# Patient Record
Sex: Male | Born: 1990 | Hispanic: No | Marital: Single | Smoking: Current every day smoker
Health system: Southern US, Community
[De-identification: ages and names within clinical notes are randomized; demographics above are authoritative.]

## PROBLEM LIST (undated history)

## (undated) DIAGNOSIS — J45909 Unspecified asthma, uncomplicated: Secondary | ICD-10-CM

## (undated) DIAGNOSIS — R45851 Suicidal ideations: Secondary | ICD-10-CM

---
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
## 2011-05-30 LAB — DIFFERENTIAL
Basophils Absolute: 0
Basophils Relative: 0
Eosinophils Absolute: 0.6
Eosinophils Relative: 4
Lymphocytes Relative: 10 — ABNORMAL LOW
Lymphs Abs: 1.5
Monocytes Absolute: 0.8
Monocytes Relative: 5
Neutro Abs: 12.4 — ABNORMAL HIGH
Neutrophils Relative %: 82 — ABNORMAL HIGH

## 2011-05-30 LAB — CBC
HCT: 46
Hemoglobin: 15.9
MCHC: 34.5
MCV: 85.8
Platelets: 269
RBC: 5.36
RDW: 13.7
WBC: 15.3 — ABNORMAL HIGH

## 2011-05-30 LAB — URINALYSIS, ROUTINE W REFLEX MICROSCOPIC
Bilirubin Urine: NEGATIVE
Ketones, ur: NEGATIVE
Nitrite: NEGATIVE
Protein, ur: NEGATIVE
pH: 6.5

## 2014-02-10 ENCOUNTER — Other Ambulatory Visit: Payer: Self-pay

## 2014-02-10 ENCOUNTER — Encounter (HOSPITAL_COMMUNITY): Payer: Self-pay | Admitting: Emergency Medicine

## 2014-02-10 DIAGNOSIS — Z8659 Personal history of other mental and behavioral disorders: Secondary | ICD-10-CM | POA: Insufficient documentation

## 2014-02-10 DIAGNOSIS — R45851 Suicidal ideations: Secondary | ICD-10-CM | POA: Insufficient documentation

## 2014-02-10 DIAGNOSIS — F17200 Nicotine dependence, unspecified, uncomplicated: Secondary | ICD-10-CM | POA: Insufficient documentation

## 2014-02-10 NOTE — ED Notes (Signed)
According to family, "having mental breakdown; recent break up with girlfriend." hx. Of SI in [employer]. Having si thoughts, "no specific acts to carry out si."

## 2014-02-11 ENCOUNTER — Inpatient Hospital Stay (HOSPITAL_COMMUNITY)
Admission: AD | Admit: 2014-02-11 | Discharge: 2014-02-14 | DRG: 885 | Disposition: A | Discharge status: Home / Self Care | Payer: Federal, State, Local not specified - Other | Source: Intra-hospital | Attending: Psychiatry | Admitting: Psychiatry

## 2014-02-11 ENCOUNTER — Encounter (HOSPITAL_COMMUNITY): Payer: Self-pay

## 2014-02-11 ENCOUNTER — Emergency Department (HOSPITAL_COMMUNITY)
Admission: EM | Admit: 2014-02-11 | Discharge: 2014-02-11 | Disposition: A | Discharge status: Other Acute Inpatient Hospital | Payer: Self-pay | Attending: Emergency Medicine | Admitting: Emergency Medicine

## 2014-02-11 DIAGNOSIS — R45851 Suicidal ideations: Secondary | ICD-10-CM

## 2014-02-11 DIAGNOSIS — F4323 Adjustment disorder with mixed anxiety and depressed mood: Secondary | ICD-10-CM | POA: Diagnosis present

## 2014-02-11 DIAGNOSIS — G4700 Insomnia, unspecified: Secondary | ICD-10-CM | POA: Diagnosis present

## 2014-02-11 DIAGNOSIS — F17200 Nicotine dependence, unspecified, uncomplicated: Secondary | ICD-10-CM | POA: Diagnosis present

## 2014-02-11 DIAGNOSIS — Z598 Other problems related to housing and economic circumstances: Secondary | ICD-10-CM | POA: Diagnosis not present

## 2014-02-11 DIAGNOSIS — J45909 Unspecified asthma, uncomplicated: Secondary | ICD-10-CM | POA: Diagnosis present

## 2014-02-11 DIAGNOSIS — F332 Major depressive disorder, recurrent severe without psychotic features: Principal | ICD-10-CM | POA: Diagnosis present

## 2014-02-11 DIAGNOSIS — Z5987 Material hardship due to limited financial resources, not elsewhere classified: Secondary | ICD-10-CM

## 2014-02-11 DIAGNOSIS — Z5989 Other problems related to housing and economic circumstances: Secondary | ICD-10-CM | POA: Diagnosis not present

## 2014-02-11 HISTORY — DX: Suicidal ideations: R45.851

## 2014-02-11 HISTORY — DX: Unspecified asthma, uncomplicated: J45.909

## 2014-02-11 LAB — COMPREHENSIVE METABOLIC PANEL
ALK PHOS: 57 U/L (ref 39–117)
ALT: 15 U/L (ref 0–53)
AST: 55 U/L — AB (ref 0–37)
Albumin: 4.2 g/dL (ref 3.5–5.2)
Anion gap: 15 (ref 5–15)
BILIRUBIN TOTAL: 0.5 mg/dL (ref 0.3–1.2)
BUN: 9 mg/dL (ref 6–23)
CALCIUM: 9.1 mg/dL (ref 8.4–10.5)
CHLORIDE: 98 meq/L (ref 96–112)
CO2: 24 meq/L (ref 19–32)
Creatinine, Ser: 1.04 mg/dL (ref 0.50–1.35)
GLUCOSE: 87 mg/dL (ref 70–99)
POTASSIUM: 4 meq/L (ref 3.7–5.3)
SODIUM: 137 meq/L (ref 137–147)
Total Protein: 7.4 g/dL (ref 6.0–8.3)

## 2014-02-11 LAB — CBC WITH DIFFERENTIAL/PLATELET
Basophils Absolute: 0 10*3/uL (ref 0.0–0.1)
Basophils Relative: 0 % (ref 0–1)
EOS PCT: 4 % (ref 0–5)
Eosinophils Absolute: 0.4 10*3/uL (ref 0.0–0.7)
HCT: 42.3 % (ref 39.0–52.0)
Hemoglobin: 15.5 g/dL (ref 13.0–17.0)
LYMPHS ABS: 2.9 10*3/uL (ref 0.7–4.0)
LYMPHS PCT: 31 % (ref 12–46)
MCH: 29.2 pg (ref 26.0–34.0)
MCHC: 36.6 g/dL — ABNORMAL HIGH (ref 30.0–36.0)
MCV: 79.7 fL (ref 78.0–100.0)
MONOS PCT: 9 % (ref 3–12)
Monocytes Absolute: 0.9 10*3/uL (ref 0.1–1.0)
Neutro Abs: 5.3 10*3/uL (ref 1.7–7.7)
Neutrophils Relative %: 56 % (ref 43–77)
PLATELETS: 235 10*3/uL (ref 150–400)
RBC: 5.31 MIL/uL (ref 4.22–5.81)
RDW: 12.9 % (ref 11.5–15.5)
WBC: 9.5 10*3/uL (ref 4.0–10.5)

## 2014-02-11 LAB — RAPID URINE DRUG SCREEN, HOSP PERFORMED
Amphetamines: NOT DETECTED
BARBITURATES: NOT DETECTED
Benzodiazepines: NOT DETECTED
Cocaine: NOT DETECTED
Opiates: NOT DETECTED
TETRAHYDROCANNABINOL: NOT DETECTED

## 2014-02-11 LAB — ETHANOL

## 2014-02-11 MED ORDER — IBUPROFEN 200 MG PO TABS
600.0000 mg | ORAL_TABLET | Freq: Three times a day (TID) | ORAL | Status: DC | PRN
  Filled 2014-02-11 (×2): qty 1

## 2014-02-11 MED ORDER — ALUM & MAG HYDROXIDE-SIMETH 200-200-20 MG/5ML PO SUSP: 30.0000 mL | ORAL | Status: DC | PRN

## 2014-02-11 MED ORDER — ACETAMINOPHEN 325 MG PO TABS: 650.0000 mg | ORAL_TABLET | Freq: Four times a day (QID) | ORAL | Status: DC | PRN

## 2014-02-11 MED ORDER — ONDANSETRON HCL 4 MG PO TABS
4.0000 mg | ORAL_TABLET | Freq: Three times a day (TID) | ORAL | Status: DC | PRN
Start: 2014-02-11 — End: 2014-02-11

## 2014-02-11 MED ORDER — LORAZEPAM 1 MG PO TABS
1.0000 mg | ORAL_TABLET | Freq: Three times a day (TID) | ORAL | Status: DC | PRN
Start: 2014-02-11 — End: 2014-02-11
  Administered 2014-02-11: 1 mg via ORAL
  Filled 2014-02-11: qty 1

## 2014-02-11 MED ORDER — ACETAMINOPHEN 325 MG PO TABS
650.0000 mg | ORAL_TABLET | ORAL | Status: DC | PRN
  Administered 2014-02-11: 650 mg via ORAL
  Filled 2014-02-11: qty 2

## 2014-02-11 MED ORDER — TRAZODONE HCL 50 MG PO TABS
50.0000 mg | ORAL_TABLET | Freq: Every evening | ORAL | Status: DC | PRN
  Filled 2014-02-11: qty 14

## 2014-02-11 MED ORDER — IBUPROFEN 600 MG PO TABS: 600.0000 mg | ORAL_TABLET | Freq: Three times a day (TID) | ORAL | Status: DC | PRN

## 2014-02-11 MED ORDER — ALUM & MAG HYDROXIDE-SIMETH 200-200-20 MG/5ML PO SUSP
30.0000 mL | ORAL | Status: DC | PRN
  Filled 2014-02-11: qty 30

## 2014-02-11 MED ORDER — MAGNESIUM HYDROXIDE 400 MG/5ML PO SUSP: 30.0000 mL | Freq: Every day | ORAL | Status: DC | PRN

## 2014-02-11 MED ORDER — LORAZEPAM 1 MG PO TABS: 1.0000 mg | ORAL_TABLET | Freq: Three times a day (TID) | ORAL | Status: DC | PRN

## 2014-02-11 MED ORDER — ACETAMINOPHEN 325 MG PO TABS: 650.0000 mg | ORAL_TABLET | ORAL | Status: DC | PRN

## 2014-02-11 MED ORDER — ONDANSETRON HCL 4 MG PO TABS: 4.0000 mg | ORAL_TABLET | Freq: Three times a day (TID) | ORAL | Status: DC | PRN

## 2014-02-11 NOTE — [facility] Suicide Risk Assessment (Signed)
[facility] INPATIENT:  Family/Significant Other Suicide Prevention Education  Suicide Prevention Education:  Education Completed; Robert SakaiDorothy Nicholson, Mother, [facility], [phone]; has been identified by the patient as the family member/significant other with whom the patient will be residing, and identified as the person(s) who will aid the patient in the event of a mental health crisis (suicidal ideations/suicide attempt).  With written consent from the patient, the family member/significant other has been provided the following suicide prevention education, prior to the and/or following the discharge of the patient.  The suicide prevention education provided includes the following:  Suicide risk factors  Suicide prevention and interventions  National Suicide Hotline telephone number  [facility] assessment telephone number  [facility] Emergency Assistance 911  [facility] and/or Residential Mobile Crisis Unit telephone number  Request made of family/significant other to:  Remove weapons (e.g., guns, rifles, knives), all items previously/currently identified as safety concern.  Mother advised patient does not have access to weapons.    Remove drugs/medications (over-the-counter, prescriptions, illicit drugs), all items previously/currently identified as a safety concern.  The family member/significant other verbalizes understanding of the suicide prevention education information provided.  The family member/significant other agrees to remove the items of safety concern listed above.  Wynn BankerHodnett, Tinlee Navarrette Hairston 02/11/2014, 1:02 PM

## 2014-02-11 NOTE — BH Assessment (Signed)
Received call for assessment. Spoke to HCA IncJoshua Geiple, PA-C who has no psychiatric history but had a "mental breakdown" resulting from a breakup with a girlfriend. Family is concerned about his behavior. Tele-assessment will be initiated.  Harlin RainFord Ellis Ria CommentWarrick Jr, LPC, [facility] Triage Specialist [phone]

## 2014-02-11 NOTE — Tx Team (Signed)
Interdisciplinary Treatment Plan Update   Date Reviewed:  02/11/2014  Time Reviewed:  10:03 AM  Progress in Treatment:   Attending groups: Yes Participating in groups: Yes Taking medication as prescribed: Yes  Tolerating medication: Yes Family/Significant other contact made:  No, but will ask patient for consent for collateral contact Patient understands diagnosis: Yes  Discussing patient identified problems/goals with staff: Yes Medical problems stabilized or resolved: Yes Denies suicidal/homicidal ideation: Yes Patient has not harmed self or others: Yes  For review of initial/current patient goals, please see plan of care.  Estimated Length of Stay:  3-4 days  Reasons for Continued Hospitalization:  Anxiety Depression Medication stabilization   New Problems/Goals identified:    Discharge Plan or Barriers:   Home with outpatient follow up to be determined  Additional Comments:  Attendees:  Patient:  02/11/2014 10:03 AM   Signature:  Sallyanne HaversF. Cobos, MD 02/11/2014 10:03 AM  Signature:  02/11/2014 10:03 AM  Signature:  Harold Barbanonecia Byrd, RN 02/11/2014 10:03 AM  Signature: Genelle GatherPatti Dukes, RN 02/11/2014 10:03 AM  Signature:  Lamount Crankerhris Judge,  RN 02/11/2014 10:03 AM  Signature:  Horace PorteousQuylle Abrish Erny, LCSW 02/11/2014 10:03 AM   02/11/2014 10:03 AM   02/11/2014 10:03 AM   02/11/2014 10:03 AM   02/11/2014  10:03 AM   02/11/2014  10:03 AM  Signature: 02/11/2014  10:03 AM    Scribe for Treatment Team:   Horace PorteousQuylle Leyan Branden,  02/11/2014 10:03 AM

## 2014-02-11 NOTE — Progress Notes (Signed)
Writer spoke with patient 1:1 and he reports that at first when he was admitted he didn't want to be here but since being here and attending groups today, it has been helpful. He reports that his mother visited him today and she is supportive of him. [name] spoke of his break up with his fiance and how now he feels that she was hiding something from him and he  Feels that its better for him to deal with this now rather than later after he had made plans to relocate. Patient is considering out patinet therapy. He currently denies si/hi/a/v hallucinations. He was informed of trazadone available to aid with sleep if needed and he reportst that he would rather try to fall asleep on his own. Patient reports plans to go back to school. Writer encouraged him to follow through with his plans. Safety maintained on unit with 15 min checks.

## 2014-02-11 NOTE — Progress Notes (Signed)
[facility] Group Notes:  (Nursing/MHT/Case Management/Adjunct)  Date:  02/11/2014  Time:  11:24 PM  Type of Therapy:  Group Therapy  Participation Level:  Did Not Attend  Participation Quality:  Did Not Attend  Affect:  Did Not Attend  Cognitive:  Did Not Attend  Insight:  None  Engagement in Group:  Did Not Attend  Modes of Intervention:  Socialization and Support  Summary of Progress/Problems: Pt. Did not attend.  Sondra ComeWilson, Tokiko Diefenderfer J 02/11/2014, 11:24 PM

## 2014-02-11 NOTE — ED Provider Notes (Signed)
CSN: [id number]     Arrival date & time 02/10/14  2345 History   First MD Initiated Contact with Patient 02/11/14 0003     Chief Complaint  Patient presents with  . Psychiatric Evaluation  . Suicidal     (Consider location/radiation/quality/duration/timing/severity/associated sxs/prior Treatment) HPI Comments: Patient with no past psychological history -- presents with complaint of "mental breakdown" after recently breaking up with his girlfriend. He states that he has had thoughts of "not wanting to be here". No plan. Patient joined the [employer] earlier this year but was medically discharged shortly afterwards for "mental breakdown". He does not know if he was diagnosed with depression, anxiety, other problems. He has no current medical complaints. No current medications. Denies tobacco, alcohol, illicit drug abuse. Denies auditory or visual hallucinations.  The history is provided by the patient and a relative.    Past Medical History  Diagnosis Date  . Depressed   . Suicidal thoughts    History reviewed. No pertinent past surgical history. History reviewed. No pertinent family history. History  Substance Use Topics  . Smoking status: Never Smoker   . Smokeless tobacco: Not on file  . Alcohol Use: No    Review of Systems  Constitutional: Negative for fever.  HENT: Negative for rhinorrhea and sore throat.   Eyes: Negative for redness.  Respiratory: Negative for cough.   Cardiovascular: Negative for chest pain.  Gastrointestinal: Negative for nausea, vomiting, abdominal pain and diarrhea.  Genitourinary: Negative for dysuria.  Musculoskeletal: Negative for myalgias.  Skin: Negative for rash.  Neurological: Negative for headaches.  Psychiatric/Behavioral: Positive for suicidal ideas.   Allergies  Review of patient's allergies indicates no known allergies.  Home Medications   Prior to Admission medications   Not on File   BP 125/80  Pulse 71  Temp(Src) 98.4 F (36.9  C) (Oral)  Resp 14  Ht 6\' 1"  (1.854 m)  Wt 225 lb (102.059 kg)  BMI 29.69 kg/m2  SpO2 98%  Physical Exam  Nursing note and vitals reviewed. Constitutional: He appears well-developed and well-nourished.  HENT:  Head: Normocephalic and atraumatic.  Eyes: Conjunctivae are normal. Right eye exhibits no discharge. Left eye exhibits no discharge.  Neck: Normal range of motion. Neck supple.  Cardiovascular: Normal rate, regular rhythm and normal heart sounds.   Pulmonary/Chest: Effort normal and breath sounds normal.  Abdominal: Soft. There is no tenderness.  Neurological: He is alert.  Skin: Skin is warm and dry.  Psychiatric: He has a normal mood and affect.    ED Course  Procedures (including critical care time) Labs Review Labs Reviewed  CBC WITH DIFFERENTIAL - Abnormal; Notable for the following:    MCHC 36.6 (*)    All other components within normal limits  COMPREHENSIVE METABOLIC PANEL - Abnormal; Notable for the following:    AST 55 (*)    All other components within normal limits  URINE RAPID DRUG SCREEN (HOSP PERFORMED)  ETHANOL    Imaging Review No results found.   EKG Interpretation None      12:14 AM Patient seen and examined. Work-up initiated. Holding orders completed. TTS eval pending.   Vital signs reviewed and are as follows: Filed Vitals:   02/10/14 2358  BP:   Pulse:   Temp: 98.4 F (36.9 C)  Resp:   BP 125/80  Pulse 71  Temp(Src) 98.4 F (36.9 C) (Oral)  Resp 14  Ht 6\' 1"  (1.854 m)  Wt 225 lb (102.059 kg)  BMI 29.69 kg/m2  SpO2 98%  2:51 AM [facility] Dr. Adela Glimpseabos service. Pt medically cleared.    MDM   Final diagnoses:  Suicidal ideation   Admit for inpt psych stabilization.   Renne CriglerJoshua Gurvir Schrom, PA-C 02/11/14 [phone]

## 2014-02-11 NOTE — BH Assessment (Signed)
Tele Assessment Note   Robert Cisneros is an 23 y.o. male, single African-American who [facility] of depression. Pt was initially brought by family members who were not present at time of assessment. Pt reports he "had a nervous breakdown" tonight due to his fiancee ending their relationship of one year. Pt states he was crying uncontrollably, feeling hurt, helpless and "wondering why a I here." Pt state he has been depressed for week with symptoms including crying spells, decreased sleep, "bad dreams", poor appetite, social withdrawal and feeling hopeless and "lifeless." Pt reports passive suicidal ideations, stating he wonders if people would be better off if he were gone and feels "I don't want to be here." He denies any suicide plan or any history of suicide attempts. 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 He reports he had similar symptoms to tonight and [facility]. Pt denies homicidal ideation or history of violence. Pt denies any psychotic symptoms. Pt denies any alcohol or substance abuse.  Pt describes how he had a childhood that lacked stability, stating his parents physically and verbally fought and eventually divorced. He reports his family relocated and changed school very frequently. He states he felt very guarded and "built up a lot of walls." He reports that he equated this relationship with his fiance as stability and intimacy and now the relationship is over he feels lost, hurt and unable to deal with it. He also reports being unemployed and having financial stress. He has no transportation. 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 Pt reports he has no outpatient mental health providers and is not on any psychiatric medication. He states his  mother and maternal grandmother have had problems with depression and anxiety.  Pt is dressed in a hospital gown, alert, oriented x4 with normal speech and normal motor behavior. Eye contact is fair and Pt was tearful throughout assessment. Pt's mood is depressed, sad, hopeless and affect is congruent with mood. Thought process is coherent and relevant. There is no indication Pt is currently responding to internal stimuli or experiencing delusional thought content. Pt was cooperative throughout assessment and receptive to recommendations.   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 Depressed   . Suicidal thoughts    Axis IV: economic problems, occupational problems and other psychosocial or environmental problems Axis V: GAF=35  Past Medical History:  Past Medical History  Diagnosis Date  . Depressed   . Suicidal thoughts     History reviewed. No pertinent past surgical history.  Family History: History reviewed. No pertinent family history.  Social History:  reports that he has never smoked. He does not have any smokeless tobacco history on file. He reports that he does not drink alcohol or use illicit drugs.  Additional Social History:  Alcohol / Drug Use Pain Medications: Denies abuse Prescriptions: Denies abuse Over the Counter: Denies abuse History of alcohol / drug use?: No history of alcohol / drug abuse Longest period of sobriety (when/how long): NA  CIWA: CIWA-Ar BP: 125/80 mmHg Pulse Rate: 71 COWS:    Allergies: No Known Allergies  Home Medications:  (Not in a hospital admission)  OB/GYN Status:  No LMP for male patient.  General Assessment Data [facility] Is this a Tele or Face-to-Face Assessment?: Tele Assessment Is this an Initial Assessment or a Re-assessment for this encounter?: Initial Assessment Living Arrangements: Other relatives (Living with  brother) Can pt return to current living arrangement?: Yes Admission Status: Voluntary Is patient capable of signing voluntary admission?: Yes Transfer from: Acute Hospital Referral Source: Self/Family/Friend     [facility] Living Arrangements: Other relatives (Living with brother) Name of Psychiatrist: None Name of Therapist: None  Education Status Is patient currently in school?: No Current Grade: NA Highest grade of school patient has completed: College Name of school: NA Contact person: NA  Risk to self Suicidal Ideation: Yes-Currently Present Suicidal Intent: No Is patient at risk for suicide?: Yes Suicidal Plan?: No Access to Means: No What has been your use of drugs/alcohol within the last 12 months?: Pt denies substance abuse Previous Attempts/Gestures: No How many times?: 0 Other Self Harm Risks: None Triggers for Past Attempts: None known Intentional Self Injurious Behavior: None Family Suicide History: No;See progress notes Recent stressful life event(s): Loss (Comment);Financial Problems;Job Loss (Fiancee broke up with him) Persecutory voices/beliefs?: No Depression: Yes Depression Symptoms: Despondent;Tearfulness;Loss of interest in usual pleasures;Feeling worthless/self pity;Guilt;Fatigue Substance abuse history and/or treatment for substance abuse?: No Suicide prevention information given to non-admitted patients: Not applicable  Risk to Others Homicidal Ideation: No Thoughts of Harm to Others: No Current Homicidal Intent: No Current Homicidal Plan: No Access to Homicidal Means: No Identified Victim: None History of harm to others?: No Assessment of Violence: None Noted Violent Behavior Description: None Does patient have access to weapons?: No Criminal Charges Pending?: No Does patient have a court date: No  Psychosis Hallucinations: None noted Delusions: None noted  Mental Status Report Appear/Hygiene: In scrubs;Unremarkable Eye  Contact: Fair Motor Activity: Unremarkable Speech: Logical/coherent Level of Consciousness: Alert;Crying Mood: Depressed;Helpless;Sad Affect: Depressed;Sad Anxiety Level: None Thought Processes: Coherent;Relevant Judgement: Unimpaired Orientation: Person;Place;Time;Situation Obsessive Compulsive Thoughts/Behaviors: None  Cognitive Functioning Concentration: Normal Memory: Recent Intact;Remote Intact IQ: Average Insight: Fair Impulse Control: Fair Appetite: Poor Weight Loss: 0 Weight Gain: 0 Sleep: Decreased Total Hours of Sleep: 6 Vegetative Symptoms: None  ADLScreening [facility] Assessment Services) Patient's cognitive ability adequate to safely complete daily activities?: Yes Patient able to express need for assistance with ADLs?: Yes Independently performs ADLs?: Yes (appropriate for developmental age)  Prior Inpatient Therapy Prior Inpatient Therapy: Yes Prior Therapy Dates: 10/2013 [facility] Reason for Treatment: Depression  Prior Outpatient Therapy Prior Outpatient Therapy: No Prior Therapy Dates: NA Prior Therapy Facilty/Provider(s): NA Reason for Treatment: NA  ADL Screening (condition at time of admission) Patient's cognitive ability adequate to safely complete daily activities?: Yes Is the patient deaf or have difficulty hearing?: No Does the patient have difficulty seeing, even when wearing glasses/contacts?: No Does the patient have difficulty concentrating, remembering, or making decisions?: No Patient able to express need for assistance with ADLs?: Yes Does the patient have difficulty dressing or bathing?: No Independently performs ADLs?: Yes (appropriate for developmental age) Does the patient have difficulty walking or climbing stairs?: No Weakness of Legs: None Weakness of Arms/Hands: None  Home Assistive Devices/Equipment Home Assistive Devices/Equipment: None    Abuse/Neglect  Assessment (Assessment to be complete while patient is alone) Physical Abuse: Denies Verbal Abuse: Denies Sexual Abuse: Denies Exploitation of patient/patient's resources: Denies Self-Neglect: Denies Values / Beliefs Cultural Requests During Hospitalization: None Spiritual Requests During Hospitalization:  None   Advance Directives (For Healthcare) Advance Directive: Patient does not have advance directive;Patient would not like information Pre-existing out of facility DNR order (yellow form or pink MOST form): No Nutrition Screen- [facility] Adult/[facility]/AP Patient's home diet: Regular  Additional Information 1:1 In Past 12 Months?: No CIRT Risk: No Elopement Risk: No Does patient have medical clearance?: Yes     Disposition: Binnie Rail, AC at [facility], confirms bed availability on inpatient unit, Observation Unit is currently closed.Jovita Gamma clinical report to Alberteen Sam, NP who accepted Pt to the service of Dr. Liam Graham, room 502-1. Notified Renne Crigler, NP and Phillips Grout, RN of acceptance.  Disposition Initial Assessment Completed for this Encounter: Yes Disposition of Patient: Inpatient treatment program Type of inpatient treatment program: Adult  Pamalee Leyden, [facility], [facility] Triage Specialist [phone]   Pamalee Leyden 02/11/2014 3:02 AM

## 2014-02-11 NOTE — Progress Notes (Signed)
Robert Cisneros is seen OOB UAL on the 500 hall today. HE remains quite tense, guarded and avoids interaction with this Clinical research associatewriter.   A HE has not completed his morning assessment, despite this nurse asking him x3. He does contract for safety with this nurse. He attends his groups but it is difficult to understand how engaged he is because he remaisn so very guarded...uptight...tense.   R Safety is in place and poc cont.
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
## 2014-02-11 NOTE — [facility] Group Notes (Signed)
[facility]   02/11/2014 12:40 PM    Participation Quality:  Appropraite  Mood/Affect:  Appropriate  Depression Rating:  1  Anxiety Rating:  1  Thoughts of Suicide:  No  Will you contract for safety?   NA  Current AVH:  No  Plan for Discharge/Comments:  Patient attended discharge planning group and actively participated in group.  Patient advised of not having an outpatient provider. CSW provided all participants with daily workbook.   Transportation Means: Patient has transportation.   Supports:  Patient has a support system.   Brice Potteiger, Joesph JulyQuylle Hairston

## 2014-02-11 NOTE — Progress Notes (Signed)
Pt reports that he had a " nervous breakdown" after his fiancee ended their relationship. Pt reports that he has been depressed, hopeless, isolative, and "not wanting to live". Pt also reports having crying spells, decreased appetite, crying spells, and insomnia. Pt denies any HI/AVH. Pt verbally contracts for safety. Pt reports a PMH of asthma. He denies having a past psych history. However, pt stated that he had experienced two " nervous breakdowns". Pt reports several family members ( mom, gma, brothers,etc.,) and friends as his support system.  [facility] his g[facility]. Pt was calm and cooperative on admission. Pt presented with a sad affect and was soft in speech.

## 2014-02-11 NOTE — ED Notes (Signed)
[facility] [employer] because of [facility] pt states" he just having thought that he don't want to be here any more." no having a plan to harm him self". Pt refers he is been a lot of stress lately. Pt denies hearing voices or having hallucinations.
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
## 2014-02-11 NOTE — BH Assessment (Signed)
Assessment complete. Binnie RailJoann Glover, [facility], confirms bed availability. Gave clinical report to Alberteen SamFran Hobson, NP who accepted Pt to the service of Dr. Liam GrahamFernando Cabos, room 502-1. Notified Renne CriglerJoshua Geiple, NP and Phillips GroutYasemia Lebron, RN of acceptance.  Harlin RainFord Ellis Ria CommentWarrick Jr, LPC, [facility] Triage Specialist [phone]

## 2014-02-11 NOTE — [facility] Group Notes (Signed)
[facility] LCSW Group Therapy  Feelings Around Relapse 1:15 -2:30        02/11/2014   Type of Therapy:  Group Therapy  Participation Level:  Appropriate  Participation Quality:  Appropriate  Affect:  Flat, Depressed  Cognitive: Appropriate  Insight:  Developing/Improving  Engagement in Therapy: Developing/Improving  Modes of Intervention:  Discussion Exploration Problem-Solving Supportive  Summary of Progress/Problems:  The topic for today was feelings around relapse.    Patient processed feelings toward relapse and was able to relate to peers.  Patient shared his relapse would be making contact again with ex-fiance who advised she wants to move out.  Patient identified coping skills that can be used to prevent a relapse.   Robert Cisneros, Robert Cisneros 02/11/2014

## 2014-02-12 DIAGNOSIS — F332 Major depressive disorder, recurrent severe without psychotic features: Principal | ICD-10-CM

## 2014-02-12 NOTE — Progress Notes (Addendum)
[facility] MD Progress Note  02/12/2014 6:34 AM Robert Cisneros  MRN:  [id number] Subjective:  He states he is feeling better than he was. Initially he didn't see a way out, now she is able to talk and express herself better. He currently expresses decreased levels of anxiety, rating 0/10 and her depression is at 0/10 (not depressed but sad). He states he is kind of getting back to normal. He reports resting well last night without the assistance of medication. Upon discharge he is hoping to relocate to [facility], and start over. He is attending groups, and have found them to be beneficial. Groups have helped him, and he is now able to see the bigger picture. Denies any SI/HI/AVH.  Diagnosis:   DSM5: Schizophrenia Disorders:   Obsessive-Compulsive Disorders:   Trauma-Stressor Disorders:   Substance/Addictive Disorders:   Depressive Disorders:  Major Depressive Disorder - Severe (296.23) Total Time spent with patient: 20 minutes  Axis I: Major Depression, Recurrent severe Axis II: Deferred Axis IV: economic problems, other psychosocial or environmental problems, problems with access to health care services and problems with primary support group Axis V: 61-70 mild symptoms  ADL's:  Intact  Sleep: Good  Appetite:  Good  Suicidal Ideation:  Plan:  Denies Intent:  Denies Means:  Denies Homicidal Ideation:  Plan:  Denies Intent:  Denies Means:  Denies AEB (as evidenced by):  Psychiatric Specialty Exam: Physical Exam  ROS  Blood pressure 107/68, pulse 93, temperature 97.9 F (36.6 C), temperature source Oral, resp. rate 16, height _0  (1.854 m), weight 100.699 kg (222 lb).Body mass index is 29.3 kg/(m^2).  General Appearance: Fairly Groomed  Engineer, water::  Good  Speech:  Clear and Coherent and Normal Rate  Volume:  Normal  Mood:  Depressed and Euthymic  Affect:  Appropriate and Congruent  Thought Process:  Coherent and Goal Directed  Orientation:  Full (Time, Place, and Person)   Thought Content:  WDL  Suicidal Thoughts:  No  Homicidal Thoughts:  No  Memory:  Immediate;   Good Recent;   Good Remote;   Good  Judgement:  Good  Insight:  Good  Psychomotor Activity:  Normal  Concentration:  Good  Recall:  Good  Fund of Knowledge:Good  Language: Good  Akathisia:  Yes  Handed:  Right  AIMS (if indicated):     Assets:  Communication Skills Desire for Improvement Financial Resources/Insurance Leisure Time Physical Health Resilience Social Support  Sleep:  Number of Hours: 6.75   Musculoskeletal: Strength & Muscle Tone: within normal limits Gait & Station: normal Patient leans: N/A  Current Medications: Current Facility-Administered Medications  Medication Dose Route Frequency Provider Last Rate Last Dose  . acetaminophen (TYLENOL) tablet 650 mg  650 mg Oral Q6H PRN Lurena Nida, NP      . alum & mag hydroxide-simeth (MAALOX/MYLANTA) 200-200-20 MG/5ML suspension 30 mL  30 mL Oral Q4H PRN Lurena Nida, NP      . magnesium hydroxide (MILK OF MAGNESIA) suspension 30 mL  30 mL Oral Daily PRN Lurena Nida, NP      . traZODone (DESYREL) tablet 50 mg  50 mg Oral QHS PRN Lurena Nida, NP        Lab Results:  Results for orders placed during the hospital encounter of 02/11/14 (from the past 48 hour(s))  CBC WITH DIFFERENTIAL     Status: Abnormal   Collection Time    02/11/14 12:27 AM      Result Value Ref  Range   WBC 9.5  4.0 - 10.5 K/uL   RBC 5.31  4.22 - 5.81 MIL/uL   Hemoglobin 15.5  13.0 - 17.0 g/dL   HCT 42.3  39.0 - 52.0 %   MCV 79.7  78.0 - 100.0 fL   MCH 29.2  26.0 - 34.0 pg   MCHC 36.6 (*) 30.0 - 36.0 g/dL   RDW 12.9  11.5 - 15.5 %   Platelets 235  150 - 400 K/uL   Neutrophils Relative % 56  43 - 77 %   Lymphocytes Relative 31  12 - 46 %   Monocytes Relative 9  3 - 12 %   Eosinophils Relative 4  0 - 5 %   Basophils Relative 0  0 - 1 %   Neutro Abs 5.3  1.7 - 7.7 K/uL   Lymphs Abs 2.9  0.7 - 4.0 K/uL   Monocytes Absolute 0.9  0.1 - 1.0  K/uL   Eosinophils Absolute 0.4  0.0 - 0.7 K/uL   Basophils Absolute 0.0  0.0 - 0.1 K/uL  COMPREHENSIVE METABOLIC PANEL     Status: Abnormal   Collection Time    02/11/14 12:27 AM      Result Value Ref Range   Sodium 137  137 - 147 mEq/L   Potassium 4.0  3.7 - 5.3 mEq/L   Chloride 98  96 - 112 mEq/L   CO2 24  19 - 32 mEq/L   Glucose, Bld 87  70 - 99 mg/dL   BUN 9  6 - 23 mg/dL   Creatinine, Ser 1.04  0.50 - 1.35 mg/dL   Calcium 9.1  8.4 - 10.5 mg/dL   Total Protein 7.4  6.0 - 8.3 g/dL   Albumin 4.2  3.5 - 5.2 g/dL   AST 55 (*) 0 - 37 U/L   ALT 15  0 - 53 U/L   Alkaline Phosphatase 57  39 - 117 U/L   Total Bilirubin 0.5  0.3 - 1.2 mg/dL   GFR calc non Af Amer >90  >90 mL/min   GFR calc Af Amer >90  >90 mL/min   Comment: (NOTE)     The eGFR has been calculated using the CKD EPI equation.     This calculation has not been validated in all clinical situations.     eGFR's persistently <90 mL/min signify possible Chronic Kidney     Disease.   Anion gap 15  5 - 15  ETHANOL     Status: None   Collection Time    02/11/14 12:27 AM      Result Value Ref Range   Alcohol, Ethyl (B) <11  0 - 11 mg/dL   Comment:            LOWEST DETECTABLE LIMIT FOR     SERUM ALCOHOL IS 11 mg/dL     FOR MEDICAL PURPOSES ONLY  URINE RAPID DRUG SCREEN (HOSP PERFORMED)     Status: None   Collection Time    02/11/14 12:35 AM      Result Value Ref Range   Opiates NONE DETECTED  NONE DETECTED   Cocaine NONE DETECTED  NONE DETECTED   Benzodiazepines NONE DETECTED  NONE DETECTED   Amphetamines NONE DETECTED  NONE DETECTED   Tetrahydrocannabinol NONE DETECTED  NONE DETECTED   Barbiturates NONE DETECTED  NONE DETECTED   Comment:            DRUG SCREEN FOR MEDICAL PURPOSES     ONLY.  IF CONFIRMATION IS NEEDED     FOR ANY PURPOSE, NOTIFY LAB     WITHIN 5 DAYS.                LOWEST DETECTABLE LIMITS     FOR URINE DRUG SCREEN     Drug Class       Cutoff (ng/mL)     Amphetamine      1000     Barbiturate       200     Benzodiazepine   250     Tricyclics       037     Opiates          300     Cocaine          300     THC              50    Physical Findings: AIMS:  , ,  ,  ,    CIWA:    COWS:     Treatment Plan Summary: Daily contact with patient to assess and evaluate symptoms and progress in treatment Medication management  Plan: Review of chart, vital signs, medications, and notes.  1-Admit for crisis management and stabilization. Estimated length of stay 3-5 days past his current stay of 1  2-Individual and group therapy encouraged  3-Medication management for depression is on hold at this time and anxiety to reduce current symptoms to base line and improve the patient's overall level of functioning: Medications reviewed with the patient and Trazodone added for sleep issues and Vistaril 25 mg every six hours PRN anxiety  4-Coping skills for depression and anxiety developing--  5-Continue crisis stabilization and management  6-Address health issues--monitoring vital signs, stable  7-Treatment plan in progress to prevent relapse of depression, angry outbursts, and anxiety  8-Psychosocial education regarding relapse prevention and self-care  9-Health care follow up as needed for any health concerns  10-Call for consult with hospitalist for additional specialty patient services as needed.   Medical Decision Making Problem Points:  Established problem, stable/improving (1), Review of last therapy session (1) and Review of psycho-social stressors (1) Data Points:  Review or order clinical lab tests (1) Review or order medicine tests (1) Review of new medications or change in dosage (2)  I certify that inpatient services furnished can reasonably be expected to improve the patient's condition.   Priscille Loveless S FNP-BC 02/12/2014, 6:34 AM I agree with assessment and plan Geralyn Flash A. Sabra Heck, M.D.

## 2014-02-12 NOTE — Progress Notes (Signed)
Robert Cisneros is seen OOB UAL on the unit..he tolerates this quietly. He remains sad, distant and unwilling to engage in conversation and tightlipped aboput his feelings as well.

## 2014-02-12 NOTE — Progress Notes (Signed)
Writer has observed patient up in the dayroom laughing and talking with peers. He attended group and participated. He reports that he is working on his coping skill and has a great support system. His mother and a friend visited him on today. He voiced no complaints and denies si/hi/a/v hallucinations. Writer praised him for his positive outlook dealing with his current break up. Safety maintained on unit with 15 min checks.

## 2014-02-12 NOTE — Progress Notes (Signed)
Psychoeducational Group Note  Date: 02/12/2014 Time:  1015  Group Topic/Focus:  Identifying Needs:   The focus of this group is to help patients identify their personal needs that have been historically problematic and identify healthy behaviors to address their needs.  Participation Level:  Active  Participation Quality:  Appropriate  Affect:  Appropriate  Cognitive:  Oriented  Insight:  Improving  Engagement in Group:  Engaged  Additional Comments:  Pt attended and was involved in the discussions. Added much to the group  Trejon Duford A  

## 2014-02-12 NOTE — [facility] Group Notes (Signed)
[facility] Group Notes:  (Nursing/MHT/Case Management/Adjunct)  Date:  02/12/2014  Time:  11:33 AM  Type of Therapy:  Psychoeducational Skills  Participation Level:  Active  Participation Quality:  Appropriate  Affect:  Appropriate  Cognitive:  Appropriate  Insight:  Appropriate  Engagement in Group:  Engaged  Modes of Intervention:  Discussion  Summary of Progress/Problems: Pt did attend self inventory group, pt reported that he was negative SI/HI, no AH/VH noted. Pt rated his depression as a 1, and his helplessness/hopelessness as a 0.     Pt reported no issues or concerns.   Jacquelyne BalintForrest, Deontaye Civello Shanta 02/12/2014, 11:33 AM

## 2014-02-13 NOTE — [facility] Group Notes (Signed)
Adult Psychoeducational Group Note  Date:  02/13/2014 Time:  12:11 AM  Group Topic/Focus:  Wrap-Up Group:   The focus of this group is to help patients review their daily goal of treatment and discuss progress on daily workbooks.  Participation Level:  Minimal  Participation Quality:  Sharing and Supportive  Affect:  Flat  Cognitive:  Appropriate  Insight: Appropriate  Engagement in Group:  Supportive  Modes of Intervention:  Support  Additional Comments:  Pt attended wrap up group this evening.  He reports a goal to develop more coping skills to use upon discharge.  He expresses 3 positive things about himself including smart, good listener, ability to help others.  Aundria RudWILKINSON, Analeise Mccleery L 02/13/2014, 12:11 AM

## 2014-02-13 NOTE — [facility] Group Notes (Signed)
[facility] Group Notes:  (Clinical Social Work)  02/13/2014   1:15-2:15PM  Summary of Progress/Problems:  The main focus of today's process group was to   identify the patient's current support system and decide on other supports that can be put in place.  The picture on workbook was used to discuss why additional supports are needed.  An emphasis was placed on using counselor, doctor, therapy groups, 12-step groups, and problem-specific support groups to expand supports.   There was also an extensive discussion about what constitutes a healthy support versus an unhealthy support.  The patient expressed full comprehension of the concepts presented.  He stated that his motivation to add supports in order to change his life for the better is 8 out of 10 (with 1 being lowest and 10 the highest).  He states that number will go up to 9 then 10 as he gets over his recent break-up, as well as starts making the effort to work out and travel, because that will be excited and will give him yet more motivation.  Type of Therapy:  Process Group  Participation Level:  Active  Participation Quality:  Attentive and Sharing  Affect:  Depressed  Cognitive:  Appropriate and Oriented  Insight:  Engaged  Engagement in Therapy:  Engaged  Modes of Intervention:  Education,  Support and ConAg[employer] FoodsProcessing  Zareen Jamison Grossman-Orr, LCSW 02/13/2014, 4:00pm

## 2014-02-13 NOTE — Progress Notes (Signed)
D Jerman OOB UAL on the 500 hall today...tolerated well. HE makes longer periods of eye contact. He attends his groups and is engaged in his poc as evidenced by the questions he asks, the personal stories he tells in his groups  And his overall  Commitment to his recovery.   A  He takes his meds as scheduled. He completes his am assessment and on it he writes he denies SI within the past 24 hrs, he rates his depression and hopelessness ""1/1" and says his dc plans are  To " stay productive and workout".   R Safety is in place and poc cont.

## 2014-02-13 NOTE — Progress Notes (Signed)
Adult Psychoeducational Group Note  Date:  02/13/2014 Time:  10:25 PM  Group Topic/Focus:  Wrap-Up Group:   The focus of this group is to help patients review their daily goal of treatment and discuss progress on daily workbooks.  Participation Level:  Active  Participation Quality:  Appropriate  Affect:  Appropriate  Cognitive:  Appropriate  Insight: Good  Engagement in Group:  Engaged  Modes of Intervention:  Clarification, Discussion and Exploration  Additional Comments:    Lorin MercyReives, Britteney Ayotte O 02/13/2014, 10:25 PM

## 2014-02-13 NOTE — Progress Notes (Signed)
[facility]  02/13/2014 2:38 PM Robert Cisneros  MRN:  [id number] Subjective:  He states he woke up feeling pretty up beat, and also rested well. Since being here he has gather motivation and gained a lot of support from peers, staff, and family members. He is ready to go out and get himself together, because he is capable of doing so much more. He is excited about new opportunities and new beginnings since he is no longer in his relationship. He plans to use his money other ways instead of for traveling expenses. He currently expresses decreased levels of anxiety, rating 0/10 and her depression is at 0/10. He is still sad, but knows it will take awhile to heal and get over her and erase memories that they once shared. He has learned about positive talks and thoughts so that he may continue to push himself to greatness. He reports resting well last night without the assistance of medication. [place] discharge [place]. He is attending groups, and have found them to be beneficial. Groups have helped him, and he is now able to see the bigger picture. Denies any SI/HI/AVH.    Diagnosis:   DSM5: Schizophrenia Disorders:   Obsessive-Compulsive Disorders:   Trauma-Stressor Disorders:   Substance/Addictive Disorders:   Depressive Disorders:  Major Depressive Disorder - Severe (296.23) Total Time spent with patient: 20 minutes  Axis I: Major Depression, Recurrent severe Axis II: Deferred Axis IV: economic problems, other psychosocial or environmental problems, problems with access to health care services and problems with primary support group Axis V: 61-70 mild symptoms  ADL's:  Intact  Sleep: Good  Appetite:  Good  Suicidal Ideation:  Plan:  Denies Intent:  Denies Means:  Denies Homicidal Ideation:  Plan:  Denies Intent:  Denies Means:  Denies AEB (as evidenced by):  Psychiatric Specialty Exam: Physical Exam  Constitutional: He is oriented  to person, place, and time. He appears well-developed.  Musculoskeletal: Normal range of motion.  Neurological: He is alert and oriented to person, place, and time.  Skin: Skin is warm and dry.    Review of Systems  Psychiatric/Behavioral: Positive for depression. Negative for suicidal ideas, hallucinations and substance abuse. The patient is not nervous/anxious and does not have insomnia.   All other systems reviewed and are negative.   Blood pressure 116/78, pulse 77, temperature 98.4 F (36.9 C), temperature source Oral, resp. rate 18, height 6\' 1"  (1.854 m), weight 100.699 kg (222 lb).Body mass index is 29.3 kg/(m^2).  General Appearance: Fairly Groomed  Patent attorneyye Contact::  Good  Speech:  Clear and Coherent and Normal Rate  Volume:  Normal  Mood:  Depressed and Euthymic  Affect:  Appropriate and Congruent  Thought Process:  Coherent and Goal Directed  Orientation:  Full (Time, Place, and Person)  Thought Content:  WDL  Suicidal Thoughts:  No  Homicidal Thoughts:  No  Memory:  Immediate;   Good Recent;   Good Remote;   Good  Judgement:  Good  Insight:  Good  Psychomotor Activity:  Normal  Concentration:  Good  Recall:  Good  Fund of Knowledge:Good  Language: Good  Akathisia:  Yes  Handed:  Right  AIMS (if indicated):     Assets:  Communication Skills Desire for Improvement Financial Resources/Insurance Leisure Time Physical Health Resilience Social Support  Sleep:  Number of Hours: 5.75   Musculoskeletal: Strength & Muscle Tone: within normal limits Gait & Station: normal Patient  leans: N/A  Current Medications: Current Facility-Administered Medications  Medication Dose Route Frequency Provider Last Rate Last Dose  . acetaminophen (TYLENOL) tablet 650 mg  650 mg Oral Q6H PRN Kristeen MansFran E Hobson, NP      . alum & mag hydroxide-simeth (MAALOX/MYLANTA) 200-200-20 MG/5ML suspension 30 mL  30 mL Oral Q4H PRN Kristeen MansFran E Hobson, NP      . magnesium hydroxide (MILK OF MAGNESIA)  suspension 30 mL  30 mL Oral Daily PRN Kristeen MansFran E Hobson, NP      . traZODone (DESYREL) tablet 50 mg  50 mg Oral QHS PRN Kristeen MansFran E Hobson, NP        Lab Results:  No results found for this or any previous visit (from the past 48 hour(s)).  Physical Findings: AIMS:  , ,  ,  ,    CIWA:    COWS:     Treatment Plan Summary: Daily contact with patient to assess and evaluate symptoms and progress in treatment Medication management  Plan: Review of chart, vital signs, medications, and notes.  1-Admit for crisis management and stabilization. Estimated length of stay 3-5 days past his current stay of 1  2-Individual and group therapy encouraged  3-Medication management for depression is on hold at this time and anxiety to reduce current symptoms to base line and improve the patient's overall level of functioning: Medications reviewed with the patient and Trazodone added for sleep issues and Vistaril 25 mg every six hours PRN anxiety  4-Coping skills for depression and anxiety developing--  5-Continue crisis stabilization and management  6-Address health issues--monitoring vital signs, stable  7-Treatment plan in progress to prevent relapse of depression, angry outbursts, and anxiety  8-Psychosocial education regarding relapse prevention and self-care  9-Health care follow up as needed for any health concerns  10-Call for consult with hospitalist for additional specialty patient services as needed. 11 Discharge plans are in progress.    Medical Decision Making Problem Points:  Established problem, stable/improving (1), Review of last therapy session (1) and Review of psycho-social stressors (1) Data Points:  Review or order clinical lab tests (1) Review or order medicine tests (1) Review of new medications or change in dosage (2)  I certify that inpatient services furnished can reasonably be expected to improve the patient's condition.   Malachy ChamberSTARKES, Laymon Stockert S FNP-BC 02/13/2014, 2:38 PM

## 2014-02-13 NOTE — Progress Notes (Signed)
Psychoeducational Group Note  Date:  02/13/2014 Time:  1015  Group Topic/Focus:  Making Healthy Choices:   The focus of this group is to help patients identify negative/unhealthy choices they were using prior to admission and identify positive/healthier coping strategies to replace them upon discharge.  Participation Level:  Active  Participation Quality:  Appropriate  Affect:  Appropriate  Cognitive:  Oriented  Insight:  Improving  Engagement in Group:  Engaged  Additional Comments:    Coraleigh Sheeran A 02/13/2014 

## 2014-02-13 NOTE — Progress Notes (Signed)
Writer spoke with patient 1:1 and he reports having had a great day. He is positive with his plans once discharged. He plans to take the remainder of the summer off and plans to start back with his schooling. He reports that the groups have been most helpful since being here. He has been up in the dayroom interacting appropriately with peers. He denies si/hi/a/v hallucinations. Writer encouraged him to follow through with his plans once discharged. Safety maintained on unit with 15 min checks.

## 2014-02-13 NOTE — Progress Notes (Signed)
Psychoeducational Group Note  Date: 02/13/2014 Time:  0930  Group Topic/Focus:  Gratefulness:  The focus of this group is to help patients identify what two things they are most grateful for in their lives. What helps ground them and to center them on their work to their recovery.  Participation Level:  Active  Participation Quality:  Appropriate  Affect:  Appropriate  Cognitive:  Oriented  Insight:  Improving  Engagement in Group:  Engaged  Additional Comments:    Manila Rommel A  

## 2014-02-14 DIAGNOSIS — F331 Major depressive disorder, recurrent, moderate: Secondary | ICD-10-CM

## 2014-02-14 NOTE — [facility] Suicide Risk Assessment (Signed)
Suicide Risk Assessment  Discharge Assessment     Demographic Factors:  Male and Adolescent or young adult  Total Time spent with patient: 45 minutes  Psychiatric Specialty Exam:     Blood pressure 123/75, pulse 91, temperature 97.7 F (36.5 C), temperature source Oral, resp. rate 18, height 6\' 1"  (1.854 m), weight 100.699 kg (222 lb).Body mass index is 29.3 kg/(m^2).  General Appearance: Fairly Groomed  Patent attorneyye Contact::  Fair  Speech:  Clear and Coherent  Volume:  Normal  Mood:  Euthymic and insightful   Affect:  Appropriate  Thought Process:  Coherent and Goal Directed  Orientation:  Full (Time, Place, and Person)  Thought Content:  events that trigger him having to come, his resolution that he is going to let go of the relationship, moves as he moves forward  Suicidal Thoughts:  No  Homicidal Thoughts:  No  Memory:  Immediate;   Fair Recent;   Fair Remote;   Fair  Judgement:  Fair  Insight:  Present  Psychomotor Activity:  Normal  Concentration:  Fair  Recall:  [employer]Fair  Fund of Knowledge:NA  Language: Fair  Akathisia:  No  Handed:  Right  AIMS (if indicated):     Assets:  Communication Skills Desire for Improvement Housing Physical Health Social Support Transportation  Sleep:  Number of Hours: 6    Musculoskeletal: Strength & Muscle Tone: within normal limits Gait & Station: normal Patient leans: N/A   Mental Status Per Nursing Assessment::   On Admission:  Self-harm thoughts  Current Mental Status by Physician: IN full contact with reality. There are no active SI plans or intent. States he feels much better. Has accepted that the relationship with his girlfriend was not healthy and he is ready to let go and move on. He is going to focus on himself and be ready to be in school next semester   Loss Factors: Loss of significant relationship  Historical Factors: NA  Risk Reduction Factors:   Religious beliefs about death, Living with another person,  especially a relative and Positive social support  Continued Clinical Symptoms:  Depression:   Severe  Cognitive Features That Contribute To Risk:  None identified   Suicide Risk:  Minimal: No identifiable suicidal ideation.  Patients presenting with no risk factors but with morbid ruminations; may be classified as minimal risk based on the severity of the depressive symptoms  Discharge Diagnoses:   AXIS I:  Major Depression recurrent moderate AXIS II:  No diagnosis AXIS III:   Past Medical History  Diagnosis Date  . Suicidal thoughts   . Asthma    AXIS IV:  other psychosocial or environmental problems AXIS V:  61-70 mild symptoms  Plan Of Care/Follow-up recommendations:  Activity:  as tolerated Diet:  regular Follow up outpatient basis Is patient on multiple antipsychotic therapies at discharge:  No   Has Patient had three or more failed trials of antipsychotic monotherapy by history:  No  Recommended Plan for Multiple Antipsychotic Therapies: NA    Robert Cisneros A 02/14/2014, 10:13 AM

## 2014-02-14 NOTE — Progress Notes (Signed)
Patient ID: Robert Cisneros, male   DOB: [birth year], 23 y.o.   MRN: [id number] Patient was discharged ambulatory to ride home with his mother.  He denies SI/HI.  He verbalizes understanding of his discharge followup. He is not on any medications.  He will be living with his grandmother and hopes to return to school soon.

## 2014-02-14 NOTE — Progress Notes (Signed)
[facility] :  Will you be returning to the same living situation after discharge: Yes,  Patient is returning home with family. At discharge, do you have transportation home?:Yes,  Patient to arrange transportation Do you have the ability to pay for your medications:  No, patient will be assisted with indigent medications.   Release of information consent forms completed and in the chart;  Patient's signature needed at discharge.  Patient to Follow up at: Follow-up Information   Follow up with [facility] In 2 days. ([facility] on Wednesday, [facility], 2014 or any weekday between 8AM - 3PM for medication management/counseling. Please go early as you will be seen on a first come-first served basis)    Contact information:   [address] [place]  [id number] [phone]       Patient denies SI/HI: Patient no longer endorsing SI/HI or other thoughts of self harm.   Safety Planning and Suicide Prevention discussed: .Reviewed with all patients during discharge planning group   Teliyah Royal, Joesph JulyQuylle Hairston 02/14/2014, 10:31 AM

## 2014-02-17 NOTE — Progress Notes (Signed)
Patient Discharge Instructions:  After Visit Summary (AVS):   Faxed to:  02/17/14 Psychiatric Admission Assessment Note:   Faxed to:  02/17/14 Suicide Risk Assessment - Discharge Assessment:   Faxed to:  02/17/14 Faxed/Sent to the Next Level Care provider:  02/17/14 [facility] @ [phone]  Jerelene ReddenSheena E Maynard, 02/17/2014, 4:10 PM

## 2014-02-21 NOTE — ED Provider Notes (Signed)
Medical screening examination/treatment/procedure(s) were conducted as a shared visit with non-physician practitioner(s) and myself.  I personally evaluated the patient during the encounter.   EKG Interpretation   Date/Time:  Thursday February 10 2014 23:57:01 EDT Ventricular Rate:  69 PR Interval:  158 QRS Duration: 80 QT Interval:  382 QTC Calculation: 409 R Axis:   69 Text Interpretation:  Normal sinus rhythm Early repolarization Normal ECG  ED PHYSICIAN INTERPRETATION AVAILABLE IN [facility] Confirmed by  TEST, Record ([id number]) on 02/12/2014 2:25:35 PM      Patient here with complaints of "mental breakdown" and persistent suicidality. Multiple life stressors - most notable is recent breakup with GF. No history of substance abuse. The patient is not under the care of a mental health professional. He has no physical complaints.  I agrees with ROS, PE, assessment and plan for admission to psychiatric unit as outlined in Mr. Emmit AlexandersGeiple PA-C's note.   Brandt LoosenJulie Zillah Alexie, MD 02/21/14 [phone]

## 2014-03-10 NOTE — Discharge Summary (Signed)
Physician Discharge Summary Note  Patient:  Robert Cisneros is an 23 y.o., male MRN:  [id number] DOB:  [birth year] Patient phone:  [phone] (home)  Patient address:   [address] [place] [address],  Total Time spent with patient: 30 minutes   Date of Admission:  02/11/2014 Date of Discharge: 02/14/2014  Reason for Admission:  MDD  Discharge Diagnoses: Active Problems:   MDD (major depressive disorder), recurrent episode, severe   Psychiatric Specialty Exam: Physical Exam  ROS  Blood pressure 123/75, pulse 91, temperature 97.7 F (36.5 C), temperature source Oral, resp. rate 18, height 6\' 1"  (1.854 m), weight 100.699 kg (222 lb).Body mass index is 29.3 kg/(m^2).   General Appearance: Fairly Groomed   Patent attorney:: Fair   Speech: Clear and Coherent   Volume: Normal   Mood: Euthymic and insightful   Affect: Appropriate   Thought Process: Coherent and Goal Directed   Orientation: Full (Time, Place, and Person)   Thought Content: events that trigger him having to come, his resolution that he is going to let go of the relationship, moves as he moves forward   Suicidal Thoughts: No   Homicidal Thoughts: No   Memory: Immediate; Fair  Recent; Fair  Remote; Fair   Judgement: Fair   Insight: Present   Psychomotor Activity: Normal   Concentration: Fair   Recall: [employer] of Knowledge:NA   Language: Fair   Akathisia: No   Handed: Right   AIMS (if indicated):   Assets: Communication Skills  Desire for Improvement  Housing  Physical Health  Social Support  Transportation   Sleep: Number of Hours: 6    Musculoskeletal:  Strength & Muscle Tone: within normal limits  Gait & Station: normal  Patient leans: N/A   DSM5:  Depressive Disorders:  Major Depressive Disorder - Severe (296.23)  Axis Diagnosis:   AXIS I:  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 Suicidal thoughts   . Asthma    AXIS IV:  other psychosocial or environmental problems and problems related to social environment AXIS V:  51-60 moderate symptoms  Level of Care:  OP  Hospital Course:  Robert Cisneros is an 23 y.o. male, single African-American who presents to [facility] ED with symptoms of depression. Pt was initially brought by family members who were not present at time of assessment. Pt reports he "had a nervous breakdown" tonight due to his fiancee ending their relationship of one year. Pt states he was crying uncontrollably, feeling hurt, helpless and "wondering why am I here." Pt stated he has been depressed for week with symptoms including crying spells, decreased sleep, "bad dreams", poor appetite, social withdrawal and feeling hopeless and "lifeless." Pt reports passive suicidal ideations, stating he wonders if people would be better off if he were gone and feels "I don't want to be here." He denies any suicide plan or any history of suicide attempts. 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 He reports he had similar symptoms to tonight and was hospitalized at the [facility] for a couple of days. Pt denies homicidal ideation or history of violence. Pt denies any psychotic symptoms. Pt denies any alcohol or substance abuse. Pt describes how he had a childhood that lacked stability, stating his parents  physically and verbally fought and eventually divorced. He reports his family relocated and changed school very frequently. He states he felt very guarded and "built up a lot of walls." He reports that he equated this relationship with his fiance as stability and intimacy and now the relationship is over he feels lost, hurt and unable to deal with it. He also reports being unemployed and having financial stress. He has no transportation. 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 Pt reports he has no outpatient mental health providers and is not on any psychiatric medication. He states his mother and maternal grandmother have had problems with depression and anxiety.  During Hospitalization: Medications managed, psychoeducation, group and individual therapy. Pt currently denies SI, HI, and Psychosis. At discharge, pt rates anxiety and depression as moderate. Pt states that he does not have a good supportive home environment and will followup with outpatient treatment at [facility] in 2 days.  Affirms agreement with medication regimen and discharge plan. Denies other physical and psychological concerns at time of discharge.   Consults:  None  Significant Diagnostic Studies:  None  Discharge Vitals:   Blood pressure 123/75, pulse 91, temperature 97.7 F (36.5 C), temperature source Oral, resp. rate 18, height 6\' 1"  (1.854 m), weight 100.699 kg (222 lb). Body mass index is 29.3 kg/(m^2). Lab Results:   No results found for this or any previous visit (from the past 72 hour(s)).  Physical Findings: AIMS: Facial and Oral Movements Muscles of Facial Expression: None, normal Lips and Perioral Area: None, normal Jaw: None, normal Tongue: None, normal,Extremity Movements Upper (arms, wrists, hands, fingers): None, normal Lower (legs, knees, ankles, toes): None, normal, Trunk Movements Neck, shoulders, hips: None, normal, Overall Severity Severity of abnormal movements (highest score from questions above): None, normal Incapacitation due to abnormal movements: None, normal Patient's awareness of abnormal movements (rate only patient's report): No Awareness, Dental Status Current problems with teeth and/or dentures?: No Does patient usually wear dentures?: No  CIWA:    COWS:     Psychiatric Specialty Exam: See Psychiatric Specialty Exam and Suicide Risk Assessment completed by  Attending Physician prior to discharge.  Discharge destination:  Home  Is patient on multiple antipsychotic therapies at discharge:  No   Has Patient had three or more failed trials of antipsychotic monotherapy by history:  No  Recommended Plan for Multiple Antipsychotic Therapies: NA     Medication List    Notice   You have not been prescribed any medications.         Follow-up Information   Follow up with [facility] In 2 days. ([facility] to [facility] on Wednesday, February 16, 2014 or any weekday between 8AM - 3PM for medication management/counseling. Please go early as you will be seen on a first come-first served basis)    Contact information:   [address] [place]  [address] [phone]       Follow-up recommendations:  Activity:  As tolerated Diet:  Heart healthy with low sodium.  Comments:  Take all medications as prescribed. Keep all follow-up appointments as scheduled.  Do not consume alcohol or use illegal drugs while on prescription medications. Report any adverse effects from your medications to your primary care provider promptly.  In the event of recurrent symptoms or worsening symptoms, call 911, a crisis hotline, or go to the nearest emergency department for evaluation.   Total Discharge  Time:  Greater than 30 minutes.  Signed: Beau FannyWithrow, John C, FNP-BC 02/14/2014, 2:06 PM I personally assessed the patient and formulated the plan Madie RenoIrving A. Dub MikesLugo, M.D.
